# Patient Record
Sex: Female | Born: 1990 | Race: White | Marital: Single
Health system: Northeastern US, Community
[De-identification: ages and names within clinical notes are randomized; demographics above are authoritative.]

---
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
## 2012-09-14 IMAGING — CR DG CHEST 2V
1 series · 2 of 2 positions shown · non-contrast
Comparison: none

REASON FOR EXAM: spontaneous pneumothorax
COMMENTS:

PROCEDURE:     DXR - DXR CHEST PA (OR AP) AND LATERAL  - August 17, 2011  [DATE]
RESULT:     Comparison is made to the prior exam of 06/29/2011. The lung
fields are clear. The heart, mediastinal and osseous structures reveal no
significant abnormalities.

[Series 1: w chest pa · 0.14mm/px · 2 of 2 slices shown]
[im 1/2]
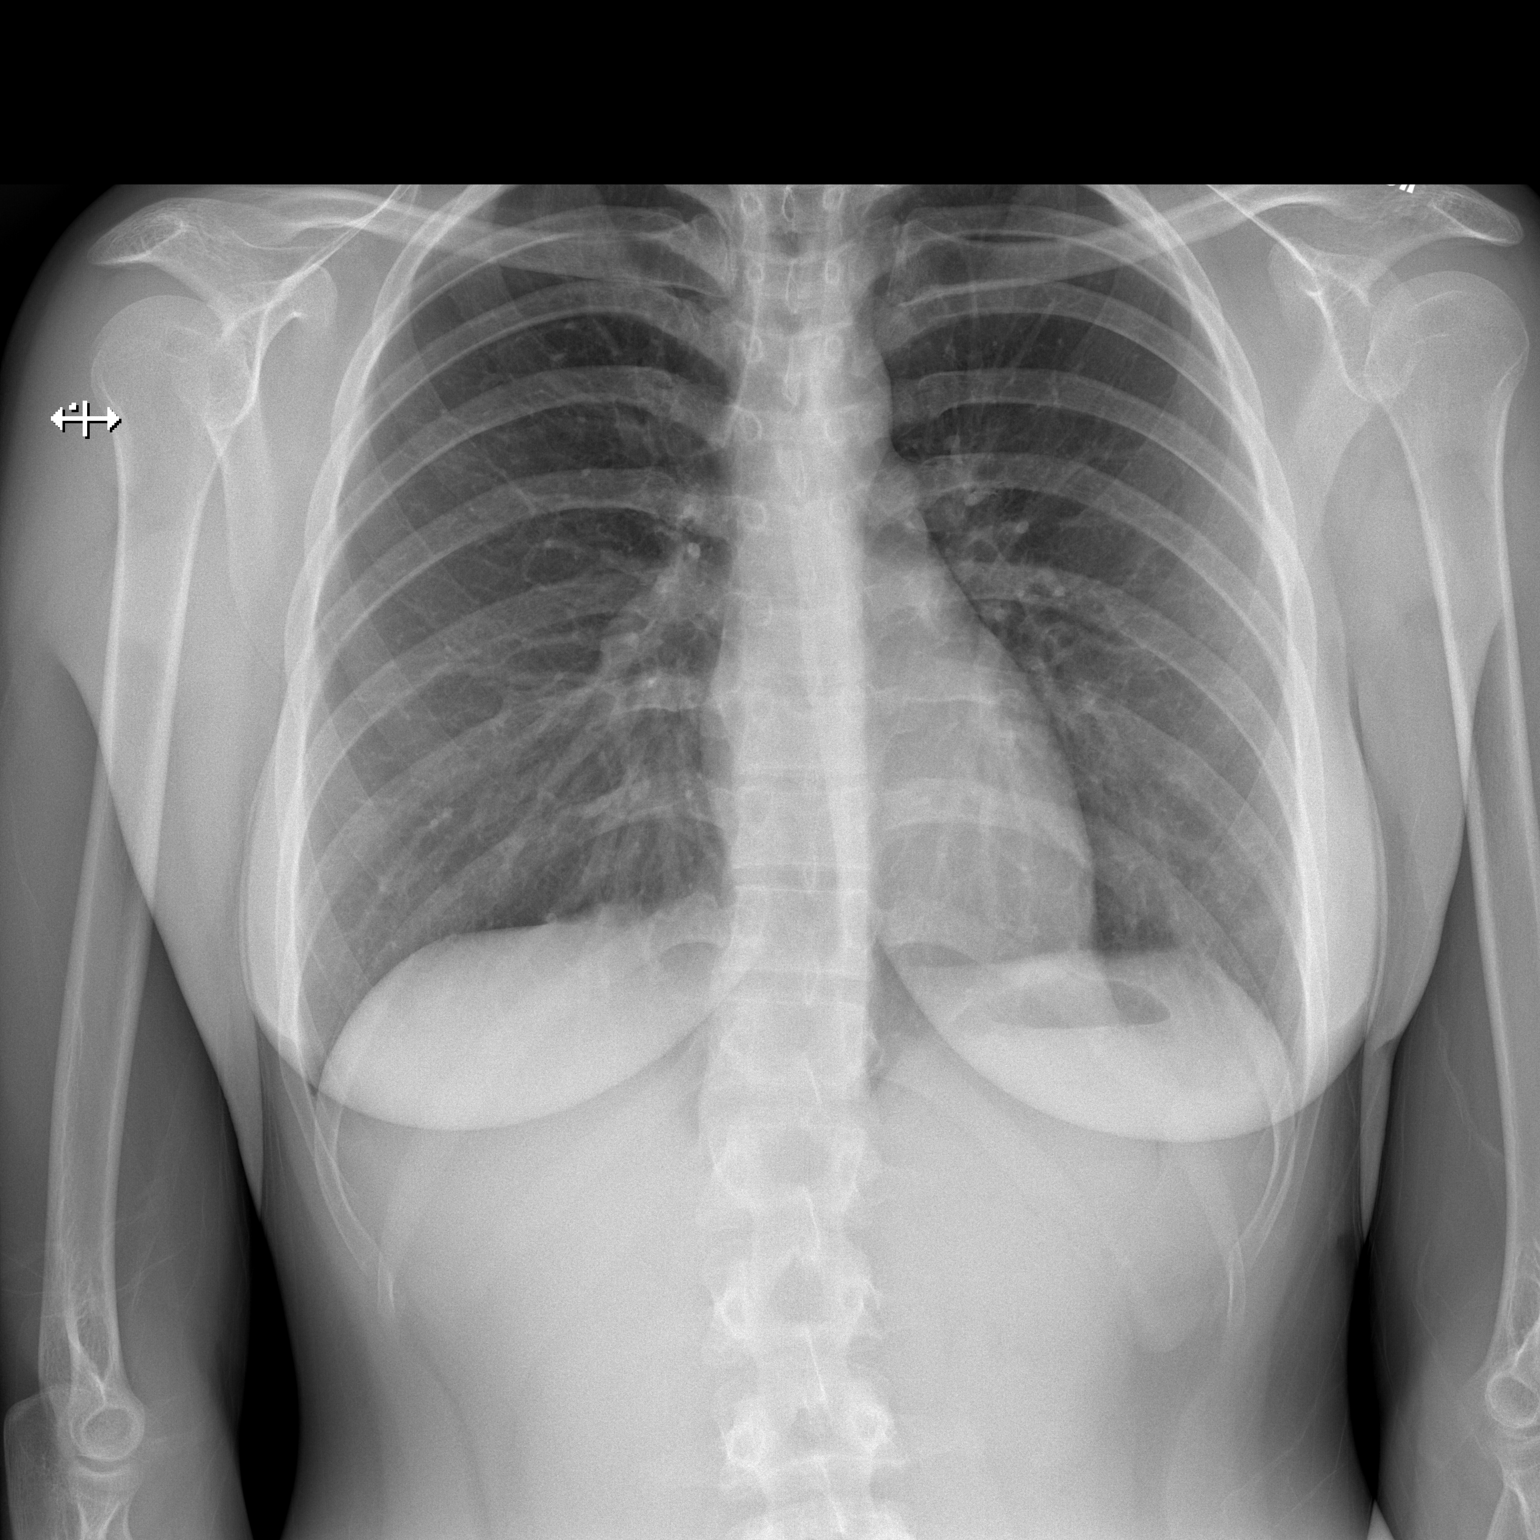
[im 2/2]
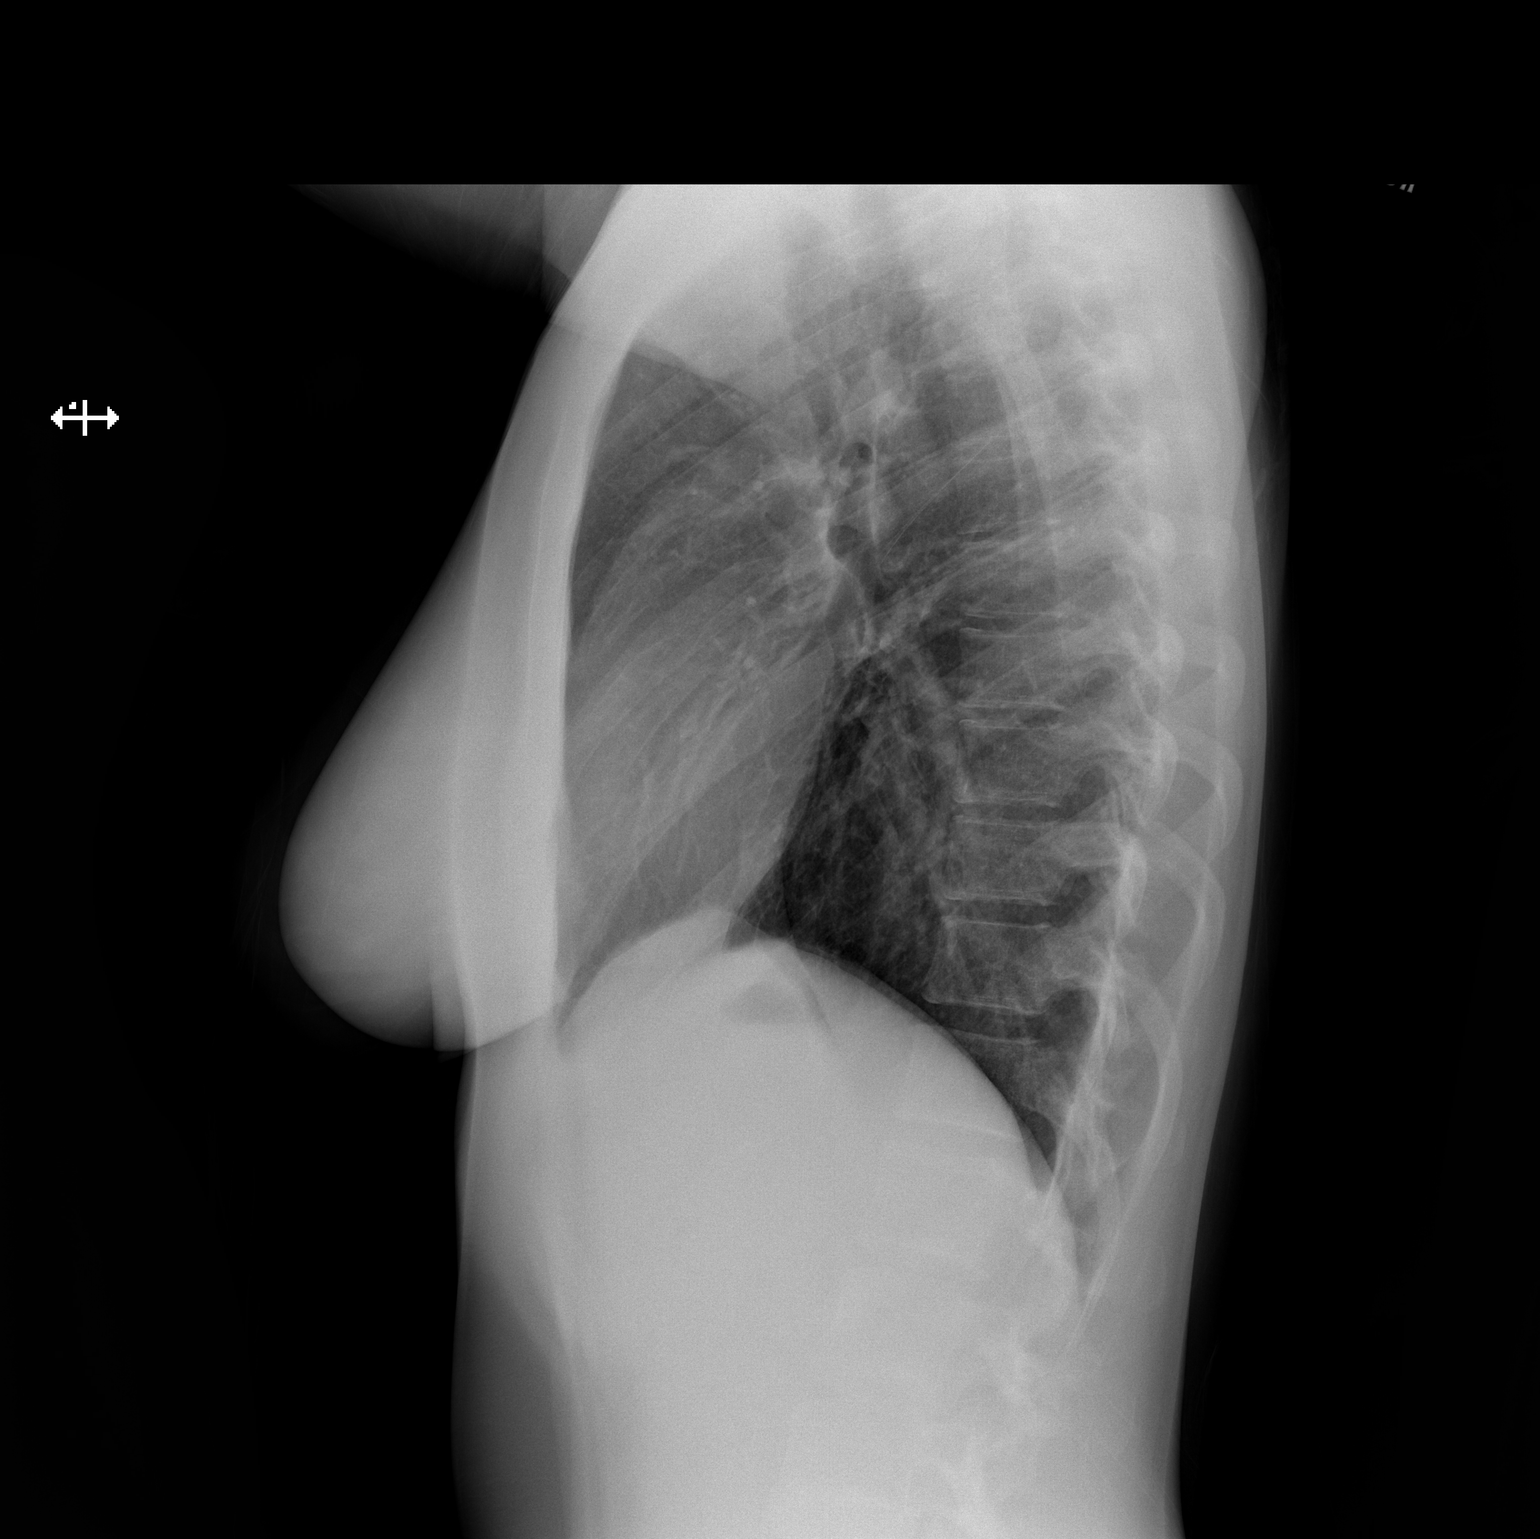

[2 of 2 positions shown; findings below may reference images not displayed]

IMPRESSION: 1.     No significant abnormalities are noted. Specifically, no recurrent
left pneumothorax is seen.

[REDACTED]

## 2014-06-21 NOTE — Op Note (Signed)
PATIENT NAME:  Colonel BaldBLAKE, Kayla N MR#:  [id number] DATE OF BIRTH:  10/18/1990  DATE OF PROCEDURE:  06/26/2011  PREOPERATIVE DIAGNOSIS: Left apical bleb with spontaneous pneumothorax.   POSTOPERATIVE DIAGNOSIS: Left apical bleb with spontaneous pneumothorax.   OPERATION PERFORMED:  1. Preoperative bronchoscopy to assess endobronchial anatomy.  2. Left thoracoscopy with apical blebectomy.  3. Mechanical pleurodesis.   SURGEON: Wesly Whisenant E. Thelma Bargeaks, MD    ASSISTANT: None.  INDICATIONS FOR PROCEDURE: Kayla Giles is a 24 year old female who presented late last week with a spontaneous pneumothorax on the left. A CT scan confirmed the presence of a 2 cm apical bleb. A chest tube was inserted which expanded her lung and she was apprised of the indications and risks of surgery. I explained to her that for most episodes of a spontaneous pneumothorax on its first occurrence we usually treat with chest tube but in her case she is going [place] of the [place] and would like to have this repaired before she leaves in approximately six weeks. Therefore, the indications and risks were explained to the patient who gave her informed consent.   DESCRIPTION OF PROCEDURE: The patient was brought to the operating suite and placed in a supine position. General endotracheal anesthesia was given with a double-lumen tube. Preoperative bronchoscopy was carried out. This was normal to the subsegmental levels bilaterally. The endobronchial anatomy was normal. There was no tumor identified.   At this point our anesthesia colleagues removed one the patient's earrings and during this procedure a small backing of the earring fell into the patient's ear canal. We attempted to contact ENT but no one was immediately available and we elected to proceed on with the procedure while the ENT surgeons came back to the hospital. Therefore, the patient was turned for a left thoracoscopy. All pressure points were carefully padded. The patient was  prepped and draped in the usual sterile fashion after the previous chest tube was removed. We began by making a single incision just anterior to the tip of the scapula. This would accommodate a 15 mm port. The left lung was deflated and the chest was entered. We then placed a thoracoscope and had a good look at the diaphragm. I saw no holes in the diaphragm. I saw nothing that looked like endometriosis. The inferior port was then created to accommodate a 5 mm port. We then placed a 5 mm camera through the most inferior port. Anteriorly just behind the breast we made another 5 mm port for grasping instruments. We then had a good look at the entire lung. We could see at the very apex of the left lung was a 2 cm cystic structure. We inflated the lung and then looked throughout the entire lung parenchyma and could only find this one single bleb. We then used a thoracoscopic stapler to excise the bleb. Once this was accomplished, we then performed a mechanical pleurodesis with a Bovie scratch pad. A single chest tube was inserted through our most inferior port and brought up through the next interspace. A 24 French tube was secured and positioned at the apex. The wounds were all irrigated as was the chest cavity. Hemostasis was complete and the wounds were then closed. The posterior wound was closed with multiple layers of running absorbable sutures. The remaining 5 mm port site was closed with just subcuticular stitch. The previous chest tube site was loosely approximated with nylon. The chest tube itself was secured with silk to the skin. At the completion of the  procedure, the patient was rolled into the supine position where she was extubated.    I did have an opportunity to perform an otoscopic examination. What I saw appeared to be a perforated drum inferiorly. There was some blood in the external canal. I could not see the backing of the ring. I elected to take the patient to the recovery room and have the ENT  surgeon see her at that point.   ____________________________ Sheppard Plumber. Thelma Barge, MD teo:drc D: 06/26/2011 14:59:21 ET T: 06/26/2011 16:03:56 ET JOB#: [id number]  cc: Marcial Pacas E. Thelma Barge, MD, <Dictator> Mark A. Egbert Garibaldi, MD Jasmine December MD ELECTRONICALLY SIGNED 06/29/2011 14:31

## 2014-06-21 NOTE — H&P (Signed)
PATIENT NAME:  [name], Kayla Giles MR#:  [id number] DATE OF BIRTH:  May 09, 1990  DATE OF ADMISSION:  06/23/2011  ADMITTING DIAGNOSES: Left spontaneous pneumothorax.   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 The patient was brought to the Emergency Room by her parents. A chest x-ray demonstrated a large pneumothorax. Imaging obtained in the Emergency Room with a CT scan of the chest demonstrates a large bleb in the left apex. There is no evidence of mediastinal shift. Surgery was consulted regarding further management. The patient denies any previous history of pneumothorax or any thoracic or lung abnormalities. She is otherwise healthy without medical problems.   ALLERGIES: Sulfa.   HOME MEDICATIONS: Birth control pills.   PAST MEDICAL HISTORY: None.   PAST SURGICAL HISTORY: None.   SOCIAL HISTORY: The patient is a Archivistcollege student. Does not smoke. Does not drink.   REVIEW OF SYSTEMS: As described above.   PHYSICAL EXAMINATION:  GENERAL: The patient has O2 mask in place. She is slightly apprehensive but in no obvious distress.   VITAL SIGNS: Temperature 96, pulse 99, blood pressure 111/62. Weight 135 pounds. BMI 21.8, height 5 feet 6 inches.   NECK: Supple. No adenopathy. No obvious subcutaneous emphysema.   LUNGS: Clear on the right, diminished on the left.   HEART: Regular rate and rhythm.   ABDOMEN: Soft and nontender.   EXTREMITIES: Warm and well perfused. No clubbing, cyanosis, or edema.   NEUROLOGIC/PSYCHIATRIC: Normal.   LABORATORY, DIAGNOSTIC, AND RADIOLOGICAL DATA: Glucose 114, BUN 13, electrolytes are normal. Liver function tests are normal. White count 12.1, hemoglobin 13.6, platelet count 226,000.   REVIEW OF X-RAYS: CT scan and chest x-ray as described above.   IMPRESSION: A 24 year old white female otherwise healthy with  spontaneous left pneumothorax suggestive of ruptured apical bleb seen on CT scan.   PLAN: Chest tube was placed. I discussed the case briefly with Dr. Hulda Marinimothy Oaks, our thoracic surgeon, who is available for intervention and consultation on Monday. In the meantime, a small-bore chest tube will be placed. I discussed the procedure with the patient and her mother and all their questions were answered.   TOTAL TIME SPENT: 60 minutes.   ____________________________ Redge GainerMark A. Egbert GaribaldiBird, MD mab:bjt D: 06/23/2011 06:35:04 ET T: 06/23/2011 09:58:31 ET JOB#: [id number]  cc: Loraine LericheMark A. Egbert GaribaldiBird, MD, <Dictator> Sheppard Plumberimothy E. Thelma Bargeaks, MD Earnie Larssonorey Musselman, MD Beryle Bagsby Kela MillinA Sofiya Ezelle MD ELECTRONICALLY SIGNED 06/25/2011 11:57

## 2014-06-21 NOTE — Consult Note (Signed)
PATIENT NAME:  Kayla Giles, Lynnley N MR#:  [id number] DATE OF BIRTH:  05/29/1990  DATE OF CONSULTATION:  06/27/2011  REFERRING PHYSICIAN:   CONSULTING PHYSICIAN:  Jerilee FieldWilliam L. Ellise Kovack, MD  REASON FOR CONSULTATION:  Right eye pain following chest tube placement yesterday evening.   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 Following the procedure she complained of eye pain in the right eye. On today's visit her eye pain is completely resolved. She had removed a small piece of plastic from her right eye on her own. She retained this piece of plastic in a container to demonstrate to me that it was in fact the cause of her discomfort. Fluorescein stain was put on the right eye and then there was no corneal abrasion seen. The eye was mildly injected. The corneoscleral anterior segment is quiet. The patient states that she has had no change in vision. She states that she is due for a routine eye examination and perhaps new spectacles.  I am recommending that we continue to place erythromycin ointment in her right eye twice daily and that she be followed up by ophthalmology if she has any eye discomfort beyond 36 hours from the date of this report. I did recommend that she follow up at [facility] for routine eye examination at her leisure.    If you need any further ophthalmic care for this patient, please feel free to reconsult the [facility].   ____________________________ Jerilee FieldWilliam L. Daryll Spisak, MD wlp:bjt D: 06/27/2011 12:46:34 ET T: 06/27/2011 13:48:50 ET JOB#: [id number]  cc: Shareef Eddinger L. Joban Colledge, MD, <Dictator> Jerilee FieldWILLIAM L Olsen Mccutchan MD ELECTRONICALLY SIGNED 07/04/2011 13:35

## 2014-06-21 NOTE — Consult Note (Signed)
PATIENT NAME:  Kayla Giles, Kayla Giles MR#:  [id number] DATE OF BIRTH:  06/30/1990  DATE OF CONSULTATION:  06/26/2011  REFERRING PHYSICIAN:  Hulda Marinimothy Oaks, MD  CONSULTING PHYSICIAN:  Cammy CopaPaul H. Kayston Jodoin, MD  REASON FOR CONSULTATION: Evaluate foreign body in left ear canal.    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 She was watched in the hospital over the weekend and had chest tube placed because of a persisting pneumothorax on the left side. This was spontaneous and did not resolve on its own. When she was asleep and placed on her left side an earring was removed from the left superior auricle. The earring accidentally fell into the ear canal and there is inability to remove it from the ear canal. I was asked to come see the patient in regards to the ear and if there is still an earring in there. There appeared to be some blood but couldn't tell if that had rolled down the ear canal or if there was some evidence of trauma.   PAST MEDICAL HISTORY: The patient has not had any ear problems as far as I know, although I am seeing her in the recovery room and I'm not sure she is lucent enough to answer questions well.   PHYSICAL EXAMINATION: The right ear canal was clear. The eardrums were thin and translucent. Middle ear space clear. Left ear canal has some fresh blood in it, appears though there may be a perforation in the posterior eardrum. There is fresh blood around it. I see something shiny down in it. It looks like it may be the post of the earring. The rest of the earring is covered over with blood.   IMPRESSION: The patient has a foreign body in the left ear canal. It looks like there is fresh trauma that occurred trying to remove this. We're going to start her on some Floxin otic drops to help wash away the blood and make sure there is no infection in there. We will  then get a chance to re-evaluate the foreign body and if still there may remove that under binocular microscope where you can see things better and make sure it is totally cleared out. We will re-evaluate in 24 hours or so once most of the fresh blood has been cleared out. ____________________________ Cammy CopaPaul H. Charm Stenner, MD phj:drc D: 06/26/2011 13:21:11 ET T: 06/26/2011 13:32:38 ET JOB#: [id number] Cammy CopaPAUL H Dodie Parisi MD ELECTRONICALLY SIGNED 07/04/2011 8:11

## 2014-06-21 NOTE — Discharge Summary (Signed)
PATIENT NAME:  Kayla Giles, Kayla Giles MR#:  [id number] DATE OF BIRTH:  Oct 03, 1990  DATE OF ADMISSION:  06/23/2011 DATE OF DISCHARGE:  06/28/2011  ADMITTING DIAGNOSIS: Spontaneous pneumothorax, left side, with apical blebs.   DISCHARGE DIAGNOSES:  1. Spontaneous pneumothorax, left side, with apical blebs.  2. Perforation of left eardrum.   HOSPITAL COURSE: [name] is a 24 year old [employer] student who presented to the emergency department on 06/23/2011 with complaints of chest pain and shortness of breath. She had a pneumothorax present on chest x-ray and a CT scan confirmed the presence of a left-sided pneumothorax with a large apical bleb. She was initially managed with a chest tube and the indications and risks of thoracoscopic bleb resection and mechanical pleurodesis were explained to the patient and her family. Because they have oversees trips planned, they elected to pursue definitive management at this time. She was taken to the operating room where a left thoracoscopy was performed. This revealed a large cystic bleb in the apex of the left lung. This was removed. Pathology did return benign lung parenchyma consistent with blebs. The patient's postoperative course was complicated by a traumatic rupture of her left eardrum. During attempts at removing an infected earring, the backing fell into the ear canal and Dr. Vernie MurdersPaul Juengel was consulted to manage this. During initial otoscopic evaluation by myself in the operating room, it appeared that she had a perforated eardrum. This was managed by Dr. Elenore RotaJuengel with eardrops. Ultimately, the backing of the earring fell out and she is scheduled to follow up with Dr. Elenore RotaJuengel. On the second postoperative day, she had no air leak and the chest tube was removed. The chest x-ray showed a very small lateral pneumothorax where the tube was present. She was given a prescription for Norco 5/325 mg 1 to 2 tablets every 4 to 6 hours. She was also given a prescription  for ofloxacin 0.3 otic drops 3 to 4 drops in the left ear three times a day to discontinue after five days. In addition, she was given a prescription for erythromycin 0.5% ophthalmic ointment to apply in her right eye three times a day. This was secondary to a possible retained foreign body after her surgery and for prevention of any infection of a possible corneal abrasion. She was seen by Dr. Druscilla BrowniePorfilio who did not recommend any specific treatment for her possible corneal abrasion. On his examination, he did not see anything that suggested a corneal abrasion.   At the time of hospital discharge she was breathing comfortably. Her wounds were clean and dry. The patient was given instructions to return in 24 hours [facility] with Dr. Thelma Bargeaks and with Dr. Vernie MurdersPaul Juengel. ____________________________ Sheppard Plumberimothy E. Thelma Bargeaks, MD teo:slb D: 06/28/2011 15:31:20 ET T: 06/29/2011 14:46:51 ET JOB#: [id number]  cc: Marcial Pacasimothy E. Thelma Bargeaks, MD, <Dictator> Jasmine DecemberIMOTHY E Tanganyika Bowlds MD ELECTRONICALLY SIGNED 07/05/2011 8:12

## 2014-06-21 NOTE — Op Note (Signed)
PATIENT NAME:  Colonel BaldBLAKE, Uchechi N MR#:  [id number] DATE OF BIRTH:  1990-03-07  DATE OF PROCEDURE:  06/23/2011  PREOPERATIVE DIAGNOSIS: Spontaneous left pneumothorax and blood disease.   POSTOPERATIVE DIAGNOSIS: Spontaneous left pneumothorax and blood disease.   PROCEDURE PERFORMED: Left chest tube insertion, 24 French straight.   SURGEON: Natale LayMark Danetra Glock, M.D.   ASSISTANT: None.   ANESTHESIA: 30 mL of 1% lidocaine, 12 mg intravenous morphine, 5 mg of intravenous Versed, 25 mg of intravenous Phenergan with continuous pulse oximetry monitoring.   INDICATIONS: This 24 year old healthy white female awoke with the sudden onset of pleuritic-type chest pain and shortness of breath. Imaging demonstrates a 40% pneumothorax on the left side. CT scan to rule out PE was negative for PE, but there was a large bleb evident on the affected side with a 40 to 50% pneumothorax without mediastinal shift. I discussed this case with the patient as well as her mother and insertion of a left chest tube was discussed with them, and all their questions were answered.   FINDINGS: Rush of air.   ESTIMATED BLOOD LOSS: Minimal.   DRAINS: 24 French straight chest tube to atrium canister at minus 20 cm water suction.   DESCRIPTION OF PROCEDURE: With informed consent, supine position, percutaneous monitoring lines were placed. Intravenous sedation was administered as described above over the course of approximately 25 minutes with continuous blood pressure and pulse oximetry readings. The patient's left chest was then sterilely prepped and draped with Betadine solution. Time-out was observed. A small skin incision was fashioned in the left mid axillary line with a scalpel after local anesthesia was infiltrated. The superior border of the rib was identified. Blunt dissection demonstrated entrance into the pleural space with a rush of air. Initially a 20 French straight chest tube was placed. However, there was no adapter for the  atrium. As such, that chest tube was removed. A 24 French chest tube was then easily inserted and secured at approximately 16 cm with multiple silk sutures. The patient's chest tube was then placed in the atrium canister with evidence of an air leak. A sterile occlusive dressing consisting of Vaseline gauze, 4 x 4's, and foam tape was then applied. A portable chest x-ray following the procedure demonstrated good placement of the chest tube with no residual pneumothorax evident. The patient tolerated the procedure well.     ____________________________ Redge GainerMark A. Egbert GaribaldiBird, MD mab:bjt D: 06/23/2011 06:31:35 ET T: 06/23/2011 10:51:06 ET JOB#: [id number]  cc: Loraine LericheMark A. Egbert GaribaldiBird, MD, <Dictator> Sheppard Plumberimothy E. Thelma Bargeaks, MD Earnie Larssonorey Musselman, MD [phone])  Raynald KempMARK A Zaynab Chipman MD ELECTRONICALLY SIGNED 06/25/2011 11:57

## 2020-02-01 ENCOUNTER — Other Ambulatory Visit: Payer: Self-pay

## 2020-02-01 ENCOUNTER — Encounter (HOSPITAL_BASED_OUTPATIENT_CLINIC_OR_DEPARTMENT_OTHER): Payer: Self-pay

## 2020-02-01 ENCOUNTER — Emergency Department (HOSPITAL_BASED_OUTPATIENT_CLINIC_OR_DEPARTMENT_OTHER): Payer: Medicaid PCCM (Primary Care Case Management)

## 2020-02-01 ENCOUNTER — Emergency Department
Admission: EM | Admit: 2020-02-01 | Discharge: 2020-02-02 | Disposition: A | Discharge status: Home / Self Care | Payer: Medicaid PCCM (Primary Care Case Management) | Attending: Emergency Medicine | Admitting: Emergency Medicine

## 2020-02-01 DIAGNOSIS — R0989 Other specified symptoms and signs involving the circulatory and respiratory systems: Secondary | ICD-10-CM | POA: Diagnosis not present

## 2020-02-01 DIAGNOSIS — R09A2 Foreign body sensation, throat: Secondary | ICD-10-CM

## 2020-02-01 DIAGNOSIS — R059 Cough, unspecified: Secondary | ICD-10-CM

## 2020-02-01 MED ORDER — LIDOCAINE VISCOUS HCL 2 % MT SOLN
10.0000 mL | Freq: Once | OROMUCOSAL | Status: AC
Start: 2020-02-01 — End: 2020-02-01
  Administered 2020-02-01: 10 mL via OROMUCOSAL
  Filled 2020-02-01: qty 15

## 2020-02-01 MED ORDER — ONDANSETRON 4 MG PO TBDP
4.0000 mg | ORAL_TABLET | Freq: Once | ORAL | Status: AC
Start: 2020-02-01 — End: 2020-02-01
  Administered 2020-02-01: 4 mg via SUBLINGUAL
  Filled 2020-02-01: qty 1

## 2020-02-01 MED ORDER — ALUMINUM & MAGNESIUM HYDROXIDE 200-200 MG/5ML PO SUSP
30.0000 mL | Freq: Once | ORAL | Status: AC
Start: 2020-02-01 — End: 2020-02-01
  Administered 2020-02-01: 30 mL via ORAL
  Filled 2020-02-01: qty 30

## 2020-02-01 NOTE — ED Triage Note (Signed)
Self presents with c/o piece of carrot stuck in her throat while eating dinner.   Able to speak in full sentences.  No respiratory distress noted.

## 2020-02-01 NOTE — ED Provider Notes (Signed)
EMERGENCY DEPARTMENT PHYSICIAN ASSISTANT NOTE    The ED nursing record was reviewed.   Select  prior medical records as available electronically through Epic were reviewed.  This patient was seen with Emergency Department attending physician Dr. Mathis Fare     CHIEF COMPLAINT    Patient presents with:  Foreign Body      HPI    Jaime Long is a 29 year old female who presents after choking on food. She reports she was eating at a restaurant when she started choking, felt like she could not breathe, vomited several times, her vision when black, then felt that she could breath again. She feels like there is something in her throat which she suspects is a piece of carrot and has tried to induce vomiting using her fingers multiple times. She denies history of esophageal issues. She denies tongue or throat swelling, denies shortness of breath, or rash.       REVIEW OF SYSTEMS    Pertinent positives are reviewed in the HPI above.       Meds:  No current facility-administered medications for this encounter.  No current outpatient medications on file.        Allergies:  Review of Patient's Allergies indicates:   Tomato                  Rash   Immunizations:    There is no immunization history on file for this patient.    Past Medical Hx:  History reviewed. No pertinent past medical history.  There is no problem list on file for this patient.     Past Surgical Hx:  No past surgical history on file.   Social Hx:  Social History    Tobacco Use      Smoking status: Not on file    Alcohol use: Not on file     Social History     Socioeconomic History    Marital status: Single     Spouse name: Not on file    Number of children: Not on file    Years of education: Not on file    Highest education level: Not on file   Occupational History    Not on file   Tobacco Use    Smoking status: Not on file   Substance and Sexual Activity    Alcohol use: Not on file    Drug use: Not on file    Sexual activity: Not on file   Other  Topics Concern    Not on file   Social History Narrative    Not on file   Social Determinants of Health  Financial Resource Strain:     Difficulty of Paying Living Expenses: Not on file  Food Insecurity:     Worried About Running Out of Food in the Last Year: Not on file    Ran Out of Food in the Last Year: Not on file  Transportation Needs:     Lack of Transportation (Medical): Not on file    Lack of Transportation (Non-Medical): Not on file  Physical Activity:     Days of Exercise per Week: Not on file    Minutes of Exercise per Session: Not on file  Stress:     Feeling of Stress : Not on file  Social Connections:     Frequency of Communication with Friends and Family: Not on file    Frequency of Social Gatherings with Friends and Family: Not on file  Attends Religious Services: Not on file    Active Member of Clubs or Organizations: Not on file    Attends Banker Meetings: Not on file    Marital Status: Not on file  Intimate Partner Violence:     Fear of Current or Ex-Partner: Not on file    Emotionally Abused: Not on file    Physically Abused: Not on file    Sexually Abused: Not on file          PHYSICAL EXAM      Vital Signs: BP 138/89    Pulse 93    Temp 97.8 F    Resp 20    SpO2 97%      Constitutional: No acute distress, cooperative, WD/WN, speaking full sentences.  HEENT: Mucous membranes moist, hearing grossly intact, no visible foreign body in oropharynx, no pharyngeal edema, erythema, or bleeding  Neck: Supple, Normal range of motion, no stridor   Pulmonary: Somewhat diminished lung sounds on right, no wheezing, rhonchi, or rales. Non-labored w/o retractions or accessory muscle use, or tripoding  Musculoskeletal:  Moving all 4 extremities, No major deformities noted. Ambulatory with steady gait.  Neurological: CNII-XII grossly intact, AOx3, No focal sensory or motor deficits noted.   Psychiatric: Appropriate for age and situation.        Medications  Given in the ED:  Medications   lidocaine (XYLOCAINE) 2 % viscous solution 10 mL (10 mLs Mouth/Throat Given 02/01/20 2330)   aluminum-magnesium hydroxide (MAALOX) 200-200 mg/5 mL suspension 30 mL (30 mLs Oral Given 02/01/20 2330)   ondansetron (ZOFRAN-ODT) disintegrating tablet 4 mg (4 mg Sublingual Given 02/01/20 2330)    Results:  No results found for this visit on 02/01/20 (from the past 24 hour(s)).      Radiology:  XR Chest 2 views   ED Interpretation    nad     XR Neck Soft Tissue   ED Interpretation    nad     Procedures:       EKG:            ED COURSE & MEDICAL DECISION MAKING      Pt is a 29 year old female who presented after an episode of choking on food followed by vomiting. She is able to manage secretions, able to tolerate PO challenge here in ED. XR soft tissue neck and chest unremarkable.   Pt remained hemodynamically stable during their stay in the emergency department. Stable for discharge home with ENT follow up.       Follow Up: With PCP, with ENT     Clinical Impression:  Foreign body sensation in throat    Disposition:   Discharge    Patient Condition:  Stable

## 2020-02-01 NOTE — Discharge Instructions (Addendum)
You were seen in the ER today after choking on food.   Your xrays did not show any blockage in your airway or esophagus.     You should receive a phone call from ENT tomorrow to schedule a follow up appointment. If you do not hear from them you should call the phone number provided.     You may follow up with your PCP this week.    Return to the ER if you develop shortness of breath, difficulty swallowing, worsening cough, or fever.

## 2020-02-01 NOTE — Procedures (Deleted)
Crockford, Pennie Rushing, MD - 05/09/2019  Formatting of this note might be different from the original.  Please see [employer] [facility] for outside imaging and/or report.

## 2020-02-02 NOTE — Narrator Note (Signed)
Patient Disposition  Patient education for diagnosis, medications, activity, diet and follow-up.  Patient left ED 12:17 AM.  Patient rep received written instructions.    Interpreter to provide instructions: No    Patient belongings with patient: YES    Have all existing LDAs been addressed? N/A    Have all IV infusions been stopped? N/A    Destination: Discharged to home  Pt given D/C instructions, voiced understanding.  Pt explained reasons for returning to ED.
